# Patient Record
Sex: Male | Born: 1985 | Race: Black or African American | Hispanic: No | Marital: Single | Smoking: Current every day smoker
Health system: Southern US, Community
[De-identification: ages and names within clinical notes are randomized; demographics above are authoritative.]

---
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
## 2009-02-15 ENCOUNTER — Emergency Department (HOSPITAL_COMMUNITY): Admission: EM | Admit: 2009-02-15 | Discharge: 2009-02-15 | Payer: Self-pay | Admitting: Family Medicine

## 2012-05-23 ENCOUNTER — Emergency Department (HOSPITAL_COMMUNITY): Payer: No Typology Code available for payment source

## 2012-05-23 ENCOUNTER — Encounter (HOSPITAL_COMMUNITY): Payer: Self-pay | Admitting: Emergency Medicine

## 2012-05-23 ENCOUNTER — Emergency Department (HOSPITAL_COMMUNITY)
Admission: EM | Admit: 2012-05-23 | Discharge: 2012-05-23 | Disposition: A | Discharge status: Home / Self Care | Payer: No Typology Code available for payment source | Attending: Emergency Medicine | Admitting: Emergency Medicine

## 2012-05-23 DIAGNOSIS — M542 Cervicalgia: Secondary | ICD-10-CM | POA: Insufficient documentation

## 2012-05-23 DIAGNOSIS — S7001XA Contusion of right hip, initial encounter: Secondary | ICD-10-CM

## 2012-05-23 DIAGNOSIS — S7000XA Contusion of unspecified hip, initial encounter: Secondary | ICD-10-CM | POA: Insufficient documentation

## 2012-05-23 DIAGNOSIS — Y9389 Activity, other specified: Secondary | ICD-10-CM | POA: Insufficient documentation

## 2012-05-23 DIAGNOSIS — Y92410 Unspecified street and highway as the place of occurrence of the external cause: Secondary | ICD-10-CM | POA: Insufficient documentation

## 2012-05-23 DIAGNOSIS — F17200 Nicotine dependence, unspecified, uncomplicated: Secondary | ICD-10-CM | POA: Insufficient documentation

## 2012-05-23 DIAGNOSIS — M546 Pain in thoracic spine: Secondary | ICD-10-CM | POA: Insufficient documentation

## 2012-05-23 DIAGNOSIS — R51 Headache: Secondary | ICD-10-CM | POA: Insufficient documentation

## 2012-05-23 MED ORDER — NAPROXEN 375 MG PO TABS: 375.0000 mg | ORAL_TABLET | Freq: Two times a day (BID) | ORAL | Status: DC

## 2012-05-23 MED ORDER — TRAMADOL HCL 50 MG PO TABS: 50.0000 mg | ORAL_TABLET | Freq: Four times a day (QID) | ORAL | Status: DC | PRN

## 2012-05-23 MED ORDER — CYCLOBENZAPRINE HCL 10 MG PO TABS: 10.0000 mg | ORAL_TABLET | Freq: Two times a day (BID) | ORAL | Status: DC | PRN
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
## 2012-05-26 NOTE — ED Provider Notes (Signed)
Medical screening examination/treatment/procedure(s) were performed by non-physician practitioner and as supervising physician I was immediately available for consultation/collaboration.  Lasheba Stevens R. Ellene Bloodsaw, MD 05/26/12 1102 

## 2016-01-06 ENCOUNTER — Encounter (HOSPITAL_COMMUNITY): Payer: Self-pay

## 2016-01-06 ENCOUNTER — Emergency Department (HOSPITAL_COMMUNITY)
Admission: EM | Admit: 2016-01-06 | Discharge: 2016-01-06 | Disposition: A | Discharge status: Home / Self Care | Payer: Self-pay | Attending: Emergency Medicine | Admitting: Emergency Medicine

## 2016-01-06 ENCOUNTER — Emergency Department (HOSPITAL_COMMUNITY): Payer: Self-pay

## 2016-01-06 DIAGNOSIS — Z79899 Other long term (current) drug therapy: Secondary | ICD-10-CM | POA: Insufficient documentation

## 2016-01-06 DIAGNOSIS — S8392XA Sprain of unspecified site of left knee, initial encounter: Secondary | ICD-10-CM | POA: Insufficient documentation

## 2016-01-06 DIAGNOSIS — S60512A Abrasion of left hand, initial encounter: Secondary | ICD-10-CM | POA: Insufficient documentation

## 2016-01-06 DIAGNOSIS — T148XXA Other injury of unspecified body region, initial encounter: Secondary | ICD-10-CM

## 2016-01-06 DIAGNOSIS — Y9389 Activity, other specified: Secondary | ICD-10-CM | POA: Insufficient documentation

## 2016-01-06 DIAGNOSIS — F17210 Nicotine dependence, cigarettes, uncomplicated: Secondary | ICD-10-CM | POA: Insufficient documentation

## 2016-01-06 DIAGNOSIS — K0889 Other specified disorders of teeth and supporting structures: Secondary | ICD-10-CM | POA: Insufficient documentation

## 2016-01-06 DIAGNOSIS — T1490XA Injury, unspecified, initial encounter: Secondary | ICD-10-CM

## 2016-01-06 DIAGNOSIS — Y929 Unspecified place or not applicable: Secondary | ICD-10-CM | POA: Insufficient documentation

## 2016-01-06 DIAGNOSIS — Z23 Encounter for immunization: Secondary | ICD-10-CM | POA: Insufficient documentation

## 2016-01-06 DIAGNOSIS — Y999 Unspecified external cause status: Secondary | ICD-10-CM | POA: Insufficient documentation

## 2016-01-06 MED ORDER — HYDROCODONE-ACETAMINOPHEN 5-325 MG PO TABS: ORAL_TABLET | ORAL | 0 refills | Status: DC

## 2016-01-06 MED ORDER — BACITRACIN ZINC 500 UNIT/GM EX OINT
TOPICAL_OINTMENT | CUTANEOUS | Status: AC
  Administered 2016-01-06: 04:00:00
  Filled 2016-01-06: qty 2.7

## 2016-01-06 MED ORDER — TETANUS-DIPHTH-ACELL PERTUSSIS 5-2.5-18.5 LF-MCG/0.5 IM SUSP
0.5000 mL | Freq: Once | INTRAMUSCULAR | Status: AC
  Administered 2016-01-06: 0.5 mL via INTRAMUSCULAR
  Filled 2016-01-06: qty 0.5

## 2016-01-06 MED ORDER — BACITRACIN ZINC 500 UNIT/GM EX OINT: 1.0000 "application " | TOPICAL_OINTMENT | Freq: Once | CUTANEOUS | Status: DC

## 2016-01-06 MED ORDER — HYDROCODONE-ACETAMINOPHEN 5-325 MG PO TABS
1.0000 | ORAL_TABLET | Freq: Once | ORAL | Status: AC
  Administered 2016-01-06: 1 via ORAL
  Filled 2016-01-06: qty 1

## 2016-01-06 NOTE — Discharge Instructions (Signed)
Wash the affected area with soap and water and apply a thin layer of topical antibiotic ointment. Do this every 12 hours.   Do not use rubbing alcohol or hydrogen peroxide.                        Look for signs of infection: if you see redness, if the area becomes warm, if pain increases sharply, there is discharge (pus), if red streaks appear or you develop fever or vomiting, RETURN immediately to the Emergency Department  for a recheck.    Take vicodin for breakthrough pain, do not drink alcohol, drive, care for children or do other critical tasks while taking vicodin.
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
## 2016-08-18 ENCOUNTER — Emergency Department (HOSPITAL_COMMUNITY)
Admission: EM | Admit: 2016-08-18 | Discharge: 2016-08-18 | Disposition: A | Discharge status: Home / Self Care | Payer: Self-pay | Attending: Emergency Medicine | Admitting: Emergency Medicine

## 2016-08-18 ENCOUNTER — Encounter (HOSPITAL_COMMUNITY): Payer: Self-pay | Admitting: *Deleted

## 2016-08-18 DIAGNOSIS — F17210 Nicotine dependence, cigarettes, uncomplicated: Secondary | ICD-10-CM | POA: Insufficient documentation

## 2016-08-18 DIAGNOSIS — Z79899 Other long term (current) drug therapy: Secondary | ICD-10-CM | POA: Insufficient documentation

## 2016-08-18 DIAGNOSIS — K0889 Other specified disorders of teeth and supporting structures: Secondary | ICD-10-CM | POA: Insufficient documentation

## 2016-08-18 MED ORDER — AMOXICILLIN 500 MG PO CAPS: 500.0000 mg | ORAL_CAPSULE | Freq: Three times a day (TID) | ORAL | 0 refills | Status: DC

## 2016-08-18 MED ORDER — ONDANSETRON 4 MG PO TBDP
4.0000 mg | ORAL_TABLET | Freq: Once | ORAL | Status: AC
  Administered 2016-08-18: 4 mg via ORAL
  Filled 2016-08-18: qty 1

## 2016-08-18 MED ORDER — OXYCODONE-ACETAMINOPHEN 5-325 MG PO TABS
2.0000 | ORAL_TABLET | Freq: Once | ORAL | Status: AC
  Administered 2016-08-18: 2 via ORAL
  Filled 2016-08-18: qty 2

## 2016-08-18 MED ORDER — MELOXICAM 15 MG PO TABS: 15.0000 mg | ORAL_TABLET | Freq: Every day | ORAL | 0 refills | Status: DC

## 2016-08-18 MED ORDER — HYDROCODONE-ACETAMINOPHEN 5-325 MG PO TABS: 1.0000 | ORAL_TABLET | Freq: Four times a day (QID) | ORAL | 0 refills | Status: DC | PRN

## 2016-08-18 NOTE — Discharge Instructions (Signed)
You have been seen by your caregiver because of dental pain.  °SEEK MEDICAL ATTENTION IF: °The exam and treatment you received today has been provided on an emergency basis only. This is not a substitute for complete medical or dental care. If your problem worsens or new symptoms (problems) appear, and you are unable to arrange prompt follow-up care with your dentist, call or return to this location. °CALL YOUR DENTIST OR RETURN IMMEDIATELY IF you develop a fever, rash, difficulty breathing or swallowing, neck or facial swelling, or other potentially serious concerns. ° ° °[employer]  °[employer]  °[facility]  °[address]  °[place], [place] [address]  °Phone [phone]  °[facility] [employer] [facility] [place], [place], exemplifies the [employer] School?s vision to improve the health and quality of life of all [place]ians by creating leaders with a passion to care for the underserved and by leading the nation in community-based, service learning oral health education. °We are committed to offering comprehensive general dental services for adults, children and special needs patients in a safe, caring and professional setting. ° °Appointments: Our clinic is open Monday through Friday 8:00 a.m. until 5:00 p.m. The amount of time scheduled for an appointment depends on the patient?s specific needs. We ask that you keep your appointed time for care or provide 24-hour notice of all appointment changes. Parents or legal guardians must accompany minor children. ° °Payment for Services: Medicaid and other insurance plans are welcome. Payment for services is due when services are rendered and may be made by cash or credit card. If you have dental insurance, we will assist you with your claim submission.  ° °Emergencies: Emergency services will be provided Monday through Friday on a  walk-in basis. Please arrive early for emergency services. After hours emergency services will be provided for patients of record as required. ° °Services:  °Comprehensive General Dentistry  °Children?s Dentistry  °Oral Surgery - Extractions  °Root Canals  °Sealants and Tooth Colored Fillings  °Crowns and Bridges  °Dentures and Partial Dentures  °Implant Services  °Periodontal Services and Cleanings  °Cosmetic Tooth Whitening  °Digital Radiography  °3-D/[facility] Imaging ° ° °

## 2016-08-18 NOTE — ED Provider Notes (Signed)
[facility] Provider Note    By signing my name below, I, Earmon Phoenix, attest that this documentation has been prepared under the direction and in the presence of Arthor Captain, PA-C. Electronically Signed: Earmon Phoenix, ED Scribe. 08/18/16. 11:03 AM.    History   Chief Complaint Chief Complaint  Patient presents with  . Dental Pain   The history is provided by the patient and medical records. No language interpreter was used.    Randall Schroeder is a 31 y.o. male who presents to the Emergency Department complaining of intermittent right sided throbbing, sharp HA that has been present for one week. He reports associated right ear pain. Pt states he believes the pain is coming from his upper right wisdom tooth. He has been taking Ibuprofen and rinsing with peroxide for pain with no significant relief. Touching the area increases the pain. He denies visual changes, fever, chills, nasal congestion, nausea, vomiting. He is a smoker.   History reviewed. No pertinent past medical history.  There are no active problems to display for this patient.   History reviewed. No pertinent surgical history.     Home Medications    Prior to Admission medications   Medication Sig Start Date End Date Taking? Authorizing Provider  amoxicillin (AMOXIL) 500 MG capsule Take 1 capsule (500 mg total) by mouth 3 (three) times daily. 08/18/16   Arthor Captain, PA-C  amoxicillin-clavulanate (AUGMENTIN) 875-125 MG tablet Take 1 tablet by mouth 2 (two) times daily. 08/24/16 08/31/16  Mathews Robinsons B, PA-C  cyclobenzaprine (FLEXERIL) 10 MG tablet Take 1 tablet (10 mg total) by mouth 2 (two) times daily as needed for muscle spasms. Patient not taking: Reported on 01/06/2016 05/23/12   Jaynie Crumble, PA-C  HYDROcodone-acetaminophen (NORCO) 5-325 MG tablet Take 1-2 tablets by mouth every 6 (six) hours as needed for severe pain. 08/18/16   Arthor Captain, PA-C  ibuprofen  (ADVIL,MOTRIN) 800 MG tablet Take 1 tablet (800 mg total) by mouth every 8 (eight) hours as needed. 08/24/16   Mathews Robinsons B, PA-C  magic mouthwash SOLN Take 5 mLs by mouth 3 (three) times daily as needed for mouth pain. 08/24/16   Georgiana Shore, PA-C  meloxicam (MOBIC) 15 MG tablet Take 1 tablet (15 mg total) by mouth daily. Take 1 daily with food. 08/18/16   Kindal Ponti, Cammy Copa, PA-C  naproxen (NAPROSYN) 375 MG tablet Take 1 tablet (375 mg total) by mouth 2 (two) times daily. Patient not taking: Reported on 01/06/2016 05/23/12   Jaynie Crumble, PA-C  traMADol (ULTRAM) 50 MG tablet Take 1 tablet (50 mg total) by mouth every 6 (six) hours as needed for pain. Patient not taking: Reported on 01/06/2016 05/23/12   Jaynie Crumble, PA-C    Family History Family History  Problem Relation Age of Onset  . Lupus Mother     Social History Social History  Substance Use Topics  . Smoking status: Current Every Day Smoker    Types: Cigarettes  . Smokeless tobacco: Never Used  . Alcohol use Yes     Comment: socially     Allergies   Patient has no known allergies.   Review of Systems Review of Systems  HENT: Positive for dental problem.   Neurological: Positive for headaches.     Physical Exam Updated Vital Signs BP 123/78 (BP Location: Left Arm)   Pulse 73   Temp 97.9 F (36.6 C) (Oral)   Resp 16   SpO2 100%   Physical Exam  Constitutional: He  is oriented to person, place, and time. He appears well-developed and well-nourished.  HENT:  Head: Normocephalic and atraumatic.  Mouth/Throat: Uvula is midline, oropharynx is clear and moist and mucous membranes are normal. No trismus in the jaw. Abnormal dentition.  Clear fluid behind bilateral TMs. Right upper third molar is markedly loose.  Neck: Normal range of motion.  Cardiovascular: Normal rate.   Pulmonary/Chest: Effort normal.  Musculoskeletal: Normal range of motion.  Neurological: He is alert and oriented to  person, place, and time.  Skin: Skin is warm and dry.  Psychiatric: He has a normal mood and affect. His behavior is normal.  Nursing note and vitals reviewed.    ED Treatments / Results  DIAGNOSTIC STUDIES: Oxygen Saturation is 100% on RA, normal by my interpretation.   COORDINATION OF CARE: 11:02 AM- Will prescribe antibiotic and pain medication. Will give dental referral. Pt verbalizes understanding and agrees to plan.  Medications  oxyCODONE-acetaminophen (PERCOCET/ROXICET) 5-325 MG per tablet 2 tablet (2 tablets Oral Given 08/18/16 1110)  ondansetron (ZOFRAN-ODT) disintegrating tablet 4 mg (4 mg Oral Given 08/18/16 1110)    Labs (all labs ordered are listed, but only abnormal results are displayed) Labs Reviewed - No data to display  EKG  EKG Interpretation None       Radiology No results found.  Procedures Procedures (including critical care time)  Medications Ordered in ED Medications  oxyCODONE-acetaminophen (PERCOCET/ROXICET) 5-325 MG per tablet 2 tablet (2 tablets Oral Given 08/18/16 1110)  ondansetron (ZOFRAN-ODT) disintegrating tablet 4 mg (4 mg Oral Given 08/18/16 1110)     Initial Impression / Assessment and Plan / ED Course  I have reviewed the triage vital signs and the nursing notes.  Pertinent labs & imaging results that were available during my care of the patient were reviewed by me and considered in my medical decision making (see chart for details).     Patient with dentalgia. No abscess requiring immediate incision and drainage. Exam not concerning for Ludwig's angina or pharyngeal abscess. Will treat with Amoxicillin. Pt instructed to follow-up with dentist.  Discussed return precautions. Pt safe for discharge.   Final Clinical Impressions(s) / ED Diagnoses   Final diagnoses:  Pain, dental    New Prescriptions Discharge Medication List as of 08/18/2016 12:06 PM    START taking these medications   Details  amoxicillin (AMOXIL) 500 MG  capsule Take 1 capsule (500 mg total) by mouth 3 (three) times daily., Starting Mon 08/18/2016, Print    meloxicam (MOBIC) 15 MG tablet Take 1 tablet (15 mg total) by mouth daily. Take 1 daily with food., Starting Mon 08/18/2016, Print        I personally performed the services described in this documentation, which was scribed in my presence. The recorded information has been reviewed and is accurate.       Arthor CaptainHarris, Heidemarie Goodnow, PA-C 08/25/16 0012    Marily MemosMesner, Jason, MD 08/25/16 [phone]

## 2016-08-18 NOTE — ED Triage Notes (Signed)
c/o right sided headache onset 1 weeks ago, states he thinks its his tooth . Using OTC meds without relief.
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
## 2016-08-19 MED FILL — HYDROCODON-APAP 5-325: 5-325 | 2 days supply | Qty: 10 | Fill #0

## 2016-08-24 ENCOUNTER — Emergency Department (HOSPITAL_COMMUNITY)
Admission: EM | Admit: 2016-08-24 | Discharge: 2016-08-24 | Disposition: A | Discharge status: Home / Self Care | Payer: Self-pay | Attending: Emergency Medicine | Admitting: Emergency Medicine

## 2016-08-24 ENCOUNTER — Encounter (HOSPITAL_COMMUNITY): Payer: Self-pay | Admitting: Emergency Medicine

## 2016-08-24 DIAGNOSIS — K0889 Other specified disorders of teeth and supporting structures: Secondary | ICD-10-CM | POA: Insufficient documentation

## 2016-08-24 DIAGNOSIS — F17210 Nicotine dependence, cigarettes, uncomplicated: Secondary | ICD-10-CM | POA: Insufficient documentation

## 2016-08-24 MED ORDER — IBUPROFEN 800 MG PO TABS
800.0000 mg | ORAL_TABLET | Freq: Once | ORAL | Status: AC
  Administered 2016-08-24: 800 mg via ORAL
  Filled 2016-08-24: qty 1

## 2016-08-24 MED ORDER — MAGIC MOUTHWASH W/LIDOCAINE
5.0000 mL | Freq: Once | ORAL | Status: DC
  Filled 2016-08-24: qty 5

## 2016-08-24 MED ORDER — AMOXICILLIN-POT CLAVULANATE 875-125 MG PO TABS: 1.0000 | ORAL_TABLET | Freq: Two times a day (BID) | ORAL | 0 refills | Status: AC

## 2016-08-24 MED ORDER — MAGIC MOUTHWASH: 5.0000 mL | Freq: Three times a day (TID) | ORAL | 0 refills | Status: DC | PRN

## 2016-08-24 MED ORDER — IBUPROFEN 800 MG PO TABS: 800.0000 mg | ORAL_TABLET | Freq: Three times a day (TID) | ORAL | 0 refills | Status: DC | PRN

## 2016-08-24 NOTE — ED Triage Notes (Signed)
Same dental pain as before but states now has bad breathe and it hurts worse

## 2016-08-24 NOTE — ED Notes (Signed)
Pt is unable to wait for [facility] to come from pharmacy due to a family emergency. Left before receiving med. Has Rx for same.

## 2016-08-24 NOTE — ED Notes (Addendum)
Pt brought back to room, pt asking how long its going to be.

## 2016-08-24 NOTE — Discharge Instructions (Signed)
Use the mouthwash for pain relief and ibuprofen and follow-up with your dentist appointment as soon as possible.  Return to the ER if you experience difficulty swallowing or breathing, or any other new concerning symptoms in the meantime

## 2016-08-24 NOTE — ED Provider Notes (Signed)
[facility] DEPT Provider Note   CSN: [id number] Arrival date & time: 08/24/16  1240  By signing my name below, I, Rosana Fret, attest that this documentation has been prepared under the direction and in the presence of non-physician practitioner, Shanda Bumps B. Clovis Riley, PA-C. Electronically Signed: Rosana Fret, ED Scribe. 08/24/16. 2:23 PM.  History   Chief Complaint Chief Complaint  Patient presents with  . Dental Pain   The history is provided by the patient. No language interpreter was used.   HPI Comments: Randall Schroeder is a 31 y.o. male who presents to the Emergency Department complaining of gradually worsening dental pain onset a few weeks ago. Pt states he came to the ED 2 weeks ago and was given antibiotics, which gave him initial relief but his symptoms became exacerbated this past week. Pt notes there is a yellow liquid draining from the area. Per pt, the area is sensitive to cold temperatures. Pt reports associated chills and gum swelling. Pt has tried cleansing with salt water and Tylenol with no relief. Pt denies fever, nausea, vomiting, diarrhea, trouble swallowing or any other complaints at this time.  History reviewed. No pertinent past medical history.  There are no active problems to display for this patient.   History reviewed. No pertinent surgical history.     Home Medications    Prior to Admission medications   Medication Sig Start Date End Date Taking? Authorizing Provider  amoxicillin (AMOXIL) 500 MG capsule Take 1 capsule (500 mg total) by mouth 3 (three) times daily. 08/18/16   Arthor Captain, PA-C  amoxicillin-clavulanate (AUGMENTIN) 875-125 MG tablet Take 1 tablet by mouth 2 (two) times daily. 08/24/16 08/31/16  Mathews Robinsons B, PA-C  cyclobenzaprine (FLEXERIL) 10 MG tablet Take 1 tablet (10 mg total) by mouth 2 (two) times daily as needed for muscle spasms. Patient not taking: Reported on 01/06/2016 05/23/12   Jaynie Crumble, PA-C    HYDROcodone-acetaminophen (NORCO) 5-325 MG tablet Take 1-2 tablets by mouth every 6 (six) hours as needed for severe pain. 08/18/16   Arthor Captain, PA-C  ibuprofen (ADVIL,MOTRIN) 800 MG tablet Take 1 tablet (800 mg total) by mouth every 8 (eight) hours as needed. 08/24/16   Mathews Robinsons B, PA-C  magic mouthwash SOLN Take 5 mLs by mouth 3 (three) times daily as needed for mouth pain. 08/24/16   Georgiana Shore, PA-C  meloxicam (MOBIC) 15 MG tablet Take 1 tablet (15 mg total) by mouth daily. Take 1 daily with food. 08/18/16   Harris, Cammy Copa, PA-C  naproxen (NAPROSYN) 375 MG tablet Take 1 tablet (375 mg total) by mouth 2 (two) times daily. Patient not taking: Reported on 01/06/2016 05/23/12   Jaynie Crumble, PA-C  traMADol (ULTRAM) 50 MG tablet Take 1 tablet (50 mg total) by mouth every 6 (six) hours as needed for pain. Patient not taking: Reported on 01/06/2016 05/23/12   Jaynie Crumble, PA-C    Family History Family History  Problem Relation Age of Onset  . Lupus Mother     Social History Social History  Substance Use Topics  . Smoking status: Current Every Day Smoker    Types: Cigarettes  . Smokeless tobacco: Never Used  . Alcohol use Yes     Comment: socially     Allergies   Patient has no known allergies.   Review of Systems Review of Systems  Constitutional: Positive for chills. Negative for fever.  HENT: Positive for dental problem. Negative for drooling, ear pain, sore throat, trouble swallowing and voice  change.   Eyes: Negative for pain and visual disturbance.  Respiratory: Negative for cough, shortness of breath, wheezing and stridor.   Cardiovascular: Negative for chest pain and palpitations.  Gastrointestinal: Negative for abdominal pain, diarrhea, nausea and vomiting.  Genitourinary: Negative for dysuria and hematuria.  Musculoskeletal: Negative for arthralgias, back pain, myalgias, neck pain and neck stiffness.  Skin: Negative for color change and  rash.  Neurological: Negative for seizures, syncope and speech difficulty.     Physical Exam Updated Vital Signs BP 120/85 (BP Location: Left Arm)   Pulse 88   Temp 98.8 F (37.1 C) (Oral)   Resp 16   SpO2 99%   Physical Exam  Constitutional: He is oriented to person, place, and time. He appears well-developed and well-nourished.  Patient is afebrile, non-toxic appearing, seating comfortably in chair in no acute distress.   HENT:  Head: Normocephalic and atraumatic.  Mouth/Throat: Uvula is midline and oropharynx is clear and moist. No oral lesions. No trismus in the jaw. No uvula swelling. No oropharyngeal exudate.    Tolerates oral secretions. Sublingual mucosa is soft and non-tender. No concern for Ludwig's angina. No visible abscess  Cardiovascular: Normal rate, regular rhythm and normal heart sounds.  Exam reveals no gallop.   No murmur heard. Pulmonary/Chest: Effort normal and breath sounds normal. No respiratory distress. He has no wheezes.  Lungs are clear bilaterally.   Lymphadenopathy:    He has no cervical adenopathy.  Neurological: He is alert and oriented to person, place, and time.  Skin: Skin is warm and dry.  Psychiatric: He has a normal mood and affect.  Nursing note and vitals reviewed.    ED Treatments / Results  DIAGNOSTIC STUDIES: Oxygen Saturation is 99% on RA, normal by my interpretation.   COORDINATION OF CARE: 2:01 PM-Discussed next steps with pt including prescription for antibiotics and pain management at home. Pt verbalized understanding and is agreeable with the plan.   Labs (all labs ordered are listed, but only abnormal results are displayed) Labs Reviewed - No data to display  EKG  EKG Interpretation None       Radiology No results found.  Procedures Procedures (including critical care time)  Medications Ordered in ED Medications  magic mouthwash w/lidocaine (not administered)  ibuprofen (ADVIL,MOTRIN) tablet 800 mg (800  mg Oral Given 08/24/16 1424)     Initial Impression / Assessment and Plan / ED Course  I have reviewed the triage vital signs and the nursing notes.  Pertinent labs & imaging results that were available during my care of the patient were reviewed by me and considered in my medical decision making (see chart for details).    Patient with dentalgia.  No abscess requiring immediate incision and drainage.  Exam not concerning for Ludwig's angina or pharyngeal abscess.  Will treat with Augmentin. Pt instructed to follow-up with dentist.He has an appointment already scheduled this week.  Discussed return precautions. Pt safe for discharge.  Pain managed in the ED with ibuprofen and Magic mouthwash. Patient reported improvement.  Discussed strict return precautions and advised to return to the emergency department if experiencing any new or worsening symptoms. Instructions were understood and patient agreed with discharge plan.  Final Clinical Impressions(s) / ED Diagnoses   Final diagnoses:  Pain, dental    New Prescriptions Discharge Medication List as of 08/24/2016  2:23 PM    START taking these medications   Details  amoxicillin-clavulanate (AUGMENTIN) 875-125 MG tablet Take 1 tablet by mouth 2 (two)  times daily., Starting Sun 08/24/2016, Until Sun 08/31/2016, Print    ibuprofen (ADVIL,MOTRIN) 800 MG tablet Take 1 tablet (800 mg total) by mouth every 8 (eight) hours as needed., Starting Sun 08/24/2016, Print    magic mouthwash SOLN Take 5 mLs by mouth 3 (three) times daily as needed for mouth pain., Starting Sun 08/24/2016, Print       I personally performed the services described in this documentation, which was scribed in my presence. The recorded information has been reviewed and is accurate.     Georgiana ShoreMitchell, Deziree Mokry B, PA-C 08/24/16 1513    Benjiman CorePickering, Nathan, MD 08/24/16 [phone]

## 2016-08-24 NOTE — ED Notes (Addendum)
Pt was seen here 5/14 for dental pain. States he took his ABX but 2 days later, started with "bad breath" and right facial pain and swelling.

## 2016-12-10 ENCOUNTER — Encounter (HOSPITAL_COMMUNITY): Payer: Self-pay | Admitting: Emergency Medicine

## 2016-12-10 ENCOUNTER — Emergency Department (HOSPITAL_COMMUNITY)
Admission: EM | Admit: 2016-12-10 | Discharge: 2016-12-10 | Disposition: A | Discharge status: Home / Self Care | Payer: Self-pay | Attending: Emergency Medicine | Admitting: Emergency Medicine

## 2016-12-10 DIAGNOSIS — R451 Restlessness and agitation: Secondary | ICD-10-CM | POA: Insufficient documentation

## 2016-12-10 DIAGNOSIS — F1410 Cocaine abuse, uncomplicated: Secondary | ICD-10-CM | POA: Insufficient documentation

## 2016-12-10 DIAGNOSIS — F1490 Cocaine use, unspecified, uncomplicated: Secondary | ICD-10-CM

## 2016-12-10 NOTE — ED Notes (Signed)
Pt. refused to wear papers scrub , pant and socks offered by RN .

## 2016-12-10 NOTE — ED Notes (Signed)
Pt. agreed to wear paper scrub/pants and socks . EDP notified that pt. Is requesting to go home . [place] police removed his handcuff.
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
## 2016-12-10 NOTE — ED Notes (Signed)
Dr. Dalene SeltzerSchlossman at bedside speaking with pt. , pt. continues to refuse vital signs check at this time . Alert and oriented , pt. calmer while speaking with EDP .

## 2017-09-07 ENCOUNTER — Emergency Department (HOSPITAL_COMMUNITY)
Admission: EM | Admit: 2017-09-07 | Discharge: 2017-09-07 | Disposition: A | Discharge status: Home / Self Care | Payer: Self-pay | Attending: Emergency Medicine | Admitting: Emergency Medicine

## 2017-09-07 ENCOUNTER — Encounter (HOSPITAL_COMMUNITY): Payer: Self-pay | Admitting: *Deleted

## 2017-09-07 ENCOUNTER — Emergency Department (HOSPITAL_COMMUNITY): Payer: Self-pay

## 2017-09-07 DIAGNOSIS — Y998 Other external cause status: Secondary | ICD-10-CM | POA: Insufficient documentation

## 2017-09-07 DIAGNOSIS — Y929 Unspecified place or not applicable: Secondary | ICD-10-CM | POA: Insufficient documentation

## 2017-09-07 DIAGNOSIS — S62336A Displaced fracture of neck of fifth metacarpal bone, right hand, initial encounter for closed fracture: Secondary | ICD-10-CM | POA: Insufficient documentation

## 2017-09-07 DIAGNOSIS — X58XXXA Exposure to other specified factors, initial encounter: Secondary | ICD-10-CM | POA: Insufficient documentation

## 2017-09-07 DIAGNOSIS — F1210 Cannabis abuse, uncomplicated: Secondary | ICD-10-CM | POA: Insufficient documentation

## 2017-09-07 DIAGNOSIS — R2231 Localized swelling, mass and lump, right upper limb: Secondary | ICD-10-CM | POA: Insufficient documentation

## 2017-09-07 DIAGNOSIS — Y9389 Activity, other specified: Secondary | ICD-10-CM | POA: Insufficient documentation

## 2017-09-07 DIAGNOSIS — F17210 Nicotine dependence, cigarettes, uncomplicated: Secondary | ICD-10-CM | POA: Insufficient documentation

## 2017-09-07 DIAGNOSIS — Z79899 Other long term (current) drug therapy: Secondary | ICD-10-CM | POA: Insufficient documentation

## 2017-09-07 DIAGNOSIS — F1410 Cocaine abuse, uncomplicated: Secondary | ICD-10-CM | POA: Insufficient documentation

## 2017-09-07 MED ORDER — IBUPROFEN 800 MG PO TABS: 800.0000 mg | ORAL_TABLET | Freq: Three times a day (TID) | ORAL | 0 refills | Status: DC

## 2017-09-07 MED ORDER — TRAMADOL HCL 50 MG PO TABS: 50.0000 mg | ORAL_TABLET | Freq: Two times a day (BID) | ORAL | 0 refills | Status: DC | PRN
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
## 2017-09-07 NOTE — Discharge Instructions (Addendum)
Take ibuprofen 3 times a day with meals.  Do not take other anti-inflammatories at the same time open (Advil, Motrin, naproxen, Aleve). You may supplement with Tramadol as needed for severe or breakthrough pain. Keep your hand elevated when able. Call Dr. Merrilee SeashoreKuzma's office tomorrow to set up an appointment for follow-up and further management of your hand. Return to the emergency room if you develop numbness, your fingers turn dark or white, or with any new or concerning symptoms.
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
## 2017-09-07 NOTE — Progress Notes (Signed)
Orthopedic Tech Progress Note Patient Details:  Randall Schroeder 05/07/1985 [id number]  Ortho Devices Type of Ortho Device: Ace wrap, Ulna gutter splint Ortho Device/Splint Location: RUE Ortho Device/Splint Interventions: Ordered, Application   Post Interventions Patient Tolerated: Well Instructions Provided: Care of device   Jennye MoccasinHughes, Rowyn Spilde Craig 09/07/2017, 8:40 PM

## 2017-09-07 NOTE — ED Notes (Signed)
Ortho paged. 

## 2017-09-09 ENCOUNTER — Encounter (HOSPITAL_BASED_OUTPATIENT_CLINIC_OR_DEPARTMENT_OTHER): Payer: Self-pay | Admitting: *Deleted

## 2017-09-09 ENCOUNTER — Other Ambulatory Visit: Payer: Self-pay

## 2017-09-09 NOTE — Pre-Procedure Instructions (Signed)
Discussed with Dr. Sampson GoonFitzgerald pt's use of Crack and marijuana - order urine drug screen the day before surgery. Told pt to bring all medications, come in Monday for urine drug screen.

## 2017-09-10 ENCOUNTER — Other Ambulatory Visit: Payer: Self-pay | Admitting: Orthopedic Surgery

## 2017-09-14 ENCOUNTER — Encounter (HOSPITAL_BASED_OUTPATIENT_CLINIC_OR_DEPARTMENT_OTHER)
Admission: RE | Admit: 2017-09-14 | Discharge: 2017-09-14 | Disposition: A | Discharge status: Home / Self Care | Payer: Self-pay | Source: Ambulatory Visit | Attending: Orthopedic Surgery | Admitting: Orthopedic Surgery

## 2017-09-14 LAB — RAPID URINE DRUG SCREEN, HOSP PERFORMED
Amphetamines: NOT DETECTED
Barbiturates: NOT DETECTED
Benzodiazepines: NOT DETECTED
COCAINE: NOT DETECTED
Opiates: NOT DETECTED
TETRAHYDROCANNABINOL: POSITIVE — AB

## 2017-09-15 ENCOUNTER — Other Ambulatory Visit: Payer: Self-pay

## 2017-09-15 ENCOUNTER — Ambulatory Visit (HOSPITAL_BASED_OUTPATIENT_CLINIC_OR_DEPARTMENT_OTHER): Payer: Self-pay | Admitting: Anesthesiology

## 2017-09-15 ENCOUNTER — Encounter (HOSPITAL_BASED_OUTPATIENT_CLINIC_OR_DEPARTMENT_OTHER): Payer: Self-pay | Admitting: Certified Registered"

## 2017-09-15 ENCOUNTER — Encounter (HOSPITAL_BASED_OUTPATIENT_CLINIC_OR_DEPARTMENT_OTHER)
Admission: RE | Disposition: A | Discharge status: Home / Self Care | Payer: Self-pay | Source: Ambulatory Visit | Attending: Orthopedic Surgery

## 2017-09-15 ENCOUNTER — Ambulatory Visit (HOSPITAL_BASED_OUTPATIENT_CLINIC_OR_DEPARTMENT_OTHER)
Admission: RE | Admit: 2017-09-15 | Discharge: 2017-09-15 | Disposition: A | Discharge status: Home / Self Care | Payer: Self-pay | Source: Ambulatory Visit | Attending: Orthopedic Surgery | Admitting: Orthopedic Surgery

## 2017-09-15 DIAGNOSIS — F17210 Nicotine dependence, cigarettes, uncomplicated: Secondary | ICD-10-CM | POA: Insufficient documentation

## 2017-09-15 DIAGNOSIS — S62336A Displaced fracture of neck of fifth metacarpal bone, right hand, initial encounter for closed fracture: Secondary | ICD-10-CM | POA: Insufficient documentation

## 2017-09-15 HISTORY — PX: CLOSED REDUCTION FINGER WITH PERCUTANEOUS PINNING: SHX5612

## 2017-09-15 SURGERY — CLOSED REDUCTION, FINGER, WITH PERCUTANEOUS PINNING
Anesthesia: General | Site: Hand | Laterality: Right

## 2017-09-15 MED ORDER — HYDROCODONE-ACETAMINOPHEN 5-325 MG PO TABS: ORAL_TABLET | ORAL | 0 refills | Status: DC

## 2017-09-15 MED ORDER — MEPERIDINE HCL 25 MG/ML IJ SOLN: 6.2500 mg | INTRAMUSCULAR | Status: DC | PRN

## 2017-09-15 MED ORDER — ONDANSETRON HCL 4 MG/2ML IJ SOLN
INTRAMUSCULAR | Status: AC
  Filled 2017-09-15: qty 14

## 2017-09-15 MED ORDER — MIDAZOLAM HCL 2 MG/2ML IJ SOLN
INTRAMUSCULAR | Status: AC
  Filled 2017-09-15: qty 2

## 2017-09-15 MED ORDER — DEXAMETHASONE SODIUM PHOSPHATE 10 MG/ML IJ SOLN
INTRAMUSCULAR | Status: AC
  Filled 2017-09-15: qty 3

## 2017-09-15 MED ORDER — CEFAZOLIN SODIUM-DEXTROSE 2-4 GM/100ML-% IV SOLN
INTRAVENOUS | Status: AC
  Filled 2017-09-15: qty 100

## 2017-09-15 MED ORDER — PROPOFOL 10 MG/ML IV BOLUS
INTRAVENOUS | Status: DC | PRN
  Administered 2017-09-15: 150 mg via INTRAVENOUS

## 2017-09-15 MED ORDER — ONDANSETRON HCL 4 MG/2ML IJ SOLN
INTRAMUSCULAR | Status: DC | PRN
  Administered 2017-09-15: 4 mg via INTRAVENOUS

## 2017-09-15 MED ORDER — FENTANYL CITRATE (PF) 100 MCG/2ML IJ SOLN
INTRAMUSCULAR | Status: AC
  Filled 2017-09-15: qty 2

## 2017-09-15 MED ORDER — CEFAZOLIN SODIUM-DEXTROSE 2-4 GM/100ML-% IV SOLN
2.0000 g | INTRAVENOUS | Status: AC
  Administered 2017-09-15: 2 g via INTRAVENOUS

## 2017-09-15 MED ORDER — DEXAMETHASONE SODIUM PHOSPHATE 10 MG/ML IJ SOLN
INTRAMUSCULAR | Status: DC | PRN
  Administered 2017-09-15: 4 mg via INTRAVENOUS

## 2017-09-15 MED ORDER — LIDOCAINE HCL (CARDIAC) PF 100 MG/5ML IV SOSY
PREFILLED_SYRINGE | INTRAVENOUS | Status: AC
  Filled 2017-09-15: qty 5

## 2017-09-15 MED ORDER — PROPOFOL 500 MG/50ML IV EMUL
INTRAVENOUS | Status: DC | PRN
  Administered 2017-09-15: 100 ug/kg/min via INTRAVENOUS

## 2017-09-15 MED ORDER — PROMETHAZINE HCL 25 MG/ML IJ SOLN: 6.2500 mg | INTRAMUSCULAR | Status: DC | PRN

## 2017-09-15 MED ORDER — FENTANYL CITRATE (PF) 100 MCG/2ML IJ SOLN
50.0000 ug | INTRAMUSCULAR | Status: DC | PRN
  Administered 2017-09-15 (×2): 50 ug via INTRAVENOUS

## 2017-09-15 MED ORDER — HYDROMORPHONE HCL 1 MG/ML IJ SOLN
INTRAMUSCULAR | Status: AC
  Filled 2017-09-15: qty 0.5

## 2017-09-15 MED ORDER — CHLORHEXIDINE GLUCONATE 4 % EX LIQD: 60.0000 mL | Freq: Once | CUTANEOUS | Status: DC

## 2017-09-15 MED ORDER — SCOPOLAMINE 1 MG/3DAYS TD PT72
1.0000 | MEDICATED_PATCH | Freq: Once | TRANSDERMAL | Status: DC | PRN
Start: 2017-09-15 — End: 2017-09-15

## 2017-09-15 MED ORDER — HYDROMORPHONE HCL 1 MG/ML IJ SOLN
0.2500 mg | INTRAMUSCULAR | Status: DC | PRN
  Administered 2017-09-15 (×3): 0.5 mg via INTRAVENOUS

## 2017-09-15 MED ORDER — BUPIVACAINE HCL (PF) 0.25 % IJ SOLN
INTRAMUSCULAR | Status: DC | PRN
Start: 2017-09-15 — End: 2017-09-15
  Administered 2017-09-15: 9 mL

## 2017-09-15 MED ORDER — LACTATED RINGERS IV SOLN: INTRAVENOUS | Status: DC

## 2017-09-15 MED ORDER — KETOROLAC TROMETHAMINE 30 MG/ML IJ SOLN
INTRAMUSCULAR | Status: AC
  Filled 2017-09-15: qty 1

## 2017-09-15 MED ORDER — PHENYLEPHRINE 40 MCG/ML (10ML) SYRINGE FOR IV PUSH (FOR BLOOD PRESSURE SUPPORT)
PREFILLED_SYRINGE | INTRAVENOUS | Status: AC
  Filled 2017-09-15: qty 20

## 2017-09-15 MED ORDER — KETOROLAC TROMETHAMINE 30 MG/ML IJ SOLN
30.0000 mg | Freq: Once | INTRAMUSCULAR | Status: AC
  Administered 2017-09-15: 30 mg via INTRAVENOUS

## 2017-09-15 MED ORDER — MIDAZOLAM HCL 2 MG/2ML IJ SOLN
1.0000 mg | INTRAMUSCULAR | Status: DC | PRN
  Administered 2017-09-15: 2 mg via INTRAVENOUS

## 2017-09-15 MED ORDER — LACTATED RINGERS IV SOLN
INTRAVENOUS | Status: DC
  Administered 2017-09-15 (×2): via INTRAVENOUS

## 2017-09-15 SURGICAL SUPPLY — 42 items
BANDAGE ACE 3X5.8 VEL STRL LF (GAUZE/BANDAGES/DRESSINGS) ×2 IMPLANT
BLADE SURG 15 STRL LF DISP TIS (BLADE) ×2 IMPLANT
BLADE SURG 15 STRL SS (BLADE) ×2
BNDG ELASTIC 2X5.8 VLCR STR LF (GAUZE/BANDAGES/DRESSINGS) IMPLANT
BNDG ESMARK 4X9 LF (GAUZE/BANDAGES/DRESSINGS) IMPLANT
BNDG GAUZE ELAST 4 BULKY (GAUZE/BANDAGES/DRESSINGS) ×2 IMPLANT
CHLORAPREP W/TINT 26ML (MISCELLANEOUS) ×2 IMPLANT
CORD BIPOLAR FORCEPS 12FT (ELECTRODE) IMPLANT
COVER BACK TABLE 60X90IN (DRAPES) ×2 IMPLANT
COVER MAYO STAND STRL (DRAPES) ×2 IMPLANT
CUFF TOURNIQUET SINGLE 18IN (TOURNIQUET CUFF) ×2 IMPLANT
DRAPE EXTREMITY T 121X128X90 (DRAPE) ×2 IMPLANT
DRAPE OEC MINIVIEW 54X84 (DRAPES) ×2 IMPLANT
DRAPE SURG 17X23 STRL (DRAPES) ×2 IMPLANT
GAUZE SPONGE 4X4 12PLY STRL (GAUZE/BANDAGES/DRESSINGS) ×2 IMPLANT
GAUZE XEROFORM 1X8 LF (GAUZE/BANDAGES/DRESSINGS) ×2 IMPLANT
GLOVE BIO SURGEON STRL SZ 6.5 (GLOVE) ×2 IMPLANT
GLOVE BIO SURGEON STRL SZ7.5 (GLOVE) ×2 IMPLANT
GLOVE BIOGEL PI IND STRL 7.0 (GLOVE) ×2 IMPLANT
GLOVE BIOGEL PI IND STRL 8 (GLOVE) ×1 IMPLANT
GLOVE BIOGEL PI IND STRL 8.5 (GLOVE) ×1 IMPLANT
GLOVE BIOGEL PI INDICATOR 7.0 (GLOVE) ×2
GLOVE BIOGEL PI INDICATOR 8 (GLOVE) ×1
GLOVE BIOGEL PI INDICATOR 8.5 (GLOVE) ×1
GLOVE SURG ORTHO 8.0 STRL STRW (GLOVE) ×2 IMPLANT
GOWN STRL REUS W/ TWL LRG LVL3 (GOWN DISPOSABLE) ×1 IMPLANT
GOWN STRL REUS W/TWL LRG LVL3 (GOWN DISPOSABLE) ×1
NEEDLE HYPO 25X1 1.5 SAFETY (NEEDLE) IMPLANT
NS IRRIG 1000ML POUR BTL (IV SOLUTION) ×2 IMPLANT
PACK BASIN DAY SURGERY FS (CUSTOM PROCEDURE TRAY) ×2 IMPLANT
PAD CAST 3X4 CTTN HI CHSV (CAST SUPPLIES) IMPLANT
PAD CAST 4YDX4 CTTN HI CHSV (CAST SUPPLIES) IMPLANT
PADDING CAST COTTON 3X4 STRL (CAST SUPPLIES)
PADDING CAST COTTON 4X4 STRL (CAST SUPPLIES)
SLEEVE SCD COMPRESS KNEE MED (MISCELLANEOUS) IMPLANT
STOCKINETTE 4X48 STRL (DRAPES) ×2 IMPLANT
SUT ETHILON 3 0 PS 1 (SUTURE) IMPLANT
SUT ETHILON 4 0 PS 2 18 (SUTURE) IMPLANT
SYR BULB 3OZ (MISCELLANEOUS) IMPLANT
SYR CONTROL 10ML LL (SYRINGE) ×2 IMPLANT
TOWEL GREEN STERILE FF (TOWEL DISPOSABLE) ×4 IMPLANT
UNDERPAD 30X30 (UNDERPADS AND DIAPERS) ×2 IMPLANT

## 2017-09-15 NOTE — Anesthesia Postprocedure Evaluation (Signed)
Anesthesia Post Note  Patient: Randall Schroeder  Procedure(s) Performed: CLOSED REDUCTION RIGHT SMALL  FINGER WITH PERCUTANEOUS PINNING (Right Hand)     Patient location during evaluation: PACU Anesthesia Type: General Level of consciousness: awake and alert Pain management: pain level controlled Vital Signs Assessment: post-procedure vital signs reviewed and stable Respiratory status: spontaneous breathing, nonlabored ventilation, respiratory function stable and patient connected to nasal cannula oxygen Cardiovascular status: blood pressure returned to baseline and stable Postop Assessment: no apparent nausea or vomiting Anesthetic complications: no    Last Vitals:  Vitals:   09/15/17 1445 09/15/17 1511  BP: 138/86 (!) 139/93  Pulse: 60 61  Resp: 15 16  Temp:  36.7 C  SpO2: 100% 100%    Last Pain:  Vitals:   09/15/17 1511  TempSrc:   PainSc: 3                  Shelton SilvasKevin D Colleen Donahoe
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
## 2017-09-15 NOTE — Anesthesia Procedure Notes (Signed)
Anesthesia Regional Block: Bier block (IV Regional)   Pre-Anesthetic Checklist: ,, timeout performed, Correct Patient, Correct Site, Correct Laterality, Correct Procedure,, site marked, surgical consent,, at surgeon's request  Laterality: Right     Needles:  Injection technique: Single-shot  Needle Type: Other      Needle Gauge: 22     Additional Needles:   Procedures:,,,,, intact distal pulses, Esmarch exsanguination, single tourniquet utilized,  Narrative:   Performed by: Personally       

## 2017-09-15 NOTE — Discharge Instructions (Addendum)
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
## 2017-09-15 NOTE — Op Note (Signed)
Intra-operative fluoroscopic images in the AP, lateral, and oblique views were taken and evaluated by myself.  Reduction and hardware placement were confirmed.  There was no intraarticular penetration of permanent hardware.  

## 2017-09-15 NOTE — Anesthesia Procedure Notes (Signed)
Procedure Name: LMA Insertion Date/Time: 09/15/2017 1:32 PM Performed by: Suhayb Anzalone, Jewel Baizeimothy D, CRNA Pre-anesthesia Checklist: Patient identified, Emergency Drugs available, Suction available and Patient being monitored Patient Re-evaluated:Patient Re-evaluated prior to induction Oxygen Delivery Method: Circle system utilized Preoxygenation: Pre-oxygenation with 100% oxygen Induction Type: IV induction Ventilation: Mask ventilation without difficulty LMA: LMA inserted LMA Size: 4.0 Number of attempts: 1 Airway Equipment and Method: Bite block Placement Confirmation: positive ETCO2 Tube secured with: Tape Dental Injury: Teeth and Oropharynx as per pre-operative assessment

## 2017-09-15 NOTE — Anesthesia Procedure Notes (Signed)
Procedure Name: MAC Date/Time: 09/15/2017 1:18 PM Performed by: Signe Colt, CRNA Pre-anesthesia Checklist: Patient identified, Emergency Drugs available, Suction available, Patient being monitored and Timeout performed Patient Re-evaluated:Patient Re-evaluated prior to induction Oxygen Delivery Method: Simple face mask

## 2017-09-15 NOTE — Op Note (Signed)
I assisted Surgeon(s) and Role:    * Betha LoaKuzma, Kevin, MD - Primary    * Cindee SaltKuzma, Zayra Devito, MD - Assisting on the Procedure(s): CLOSED REDUCTION RIGHT SMALL  FINGER WITH PERCUTANEOUS PINNING on 09/15/2017.  I provided assistance on this case as follows: setup, reduction stabilization, fixation, application of the dressings and splints. Electronically signed by: Nicki ReaperKUZMA,Jolleen Seman R, MD Date: 09/15/2017 Time: 1:50 PM

## 2017-09-15 NOTE — Transfer of Care (Signed)
Immediate Anesthesia Transfer of Care Note  Patient: Lou MinerMaurice A Tu  Procedure(s) Performed: CLOSED REDUCTION RIGHT SMALL  FINGER WITH PERCUTANEOUS PINNING (Right Hand)  Patient Location: PACU  Anesthesia Type:General and Bier block  Level of Consciousness: awake and patient cooperative  Airway & Oxygen Therapy: Patient Spontanous Breathing and Patient connected to face mask oxygen  Post-op Assessment: Report given to RN and Post -op Vital signs reviewed and stable  Post vital signs: Reviewed and stable  Last Vitals:  Vitals Value Taken Time  BP    Temp    Pulse 71 09/15/2017  1:52 PM  Resp 15 09/15/2017  1:52 PM  SpO2 100 % 09/15/2017  1:52 PM  Vitals shown include unvalidated device data.  Last Pain:  Vitals:   09/15/17 1236  TempSrc: Oral  PainSc: 0-No pain         Complications: No apparent anesthesia complications

## 2017-09-16 ENCOUNTER — Encounter (HOSPITAL_BASED_OUTPATIENT_CLINIC_OR_DEPARTMENT_OTHER): Payer: Self-pay | Admitting: Orthopedic Surgery

## 2019-10-30 ENCOUNTER — Emergency Department (HOSPITAL_COMMUNITY)
Admission: EM | Admit: 2019-10-30 | Discharge: 2019-10-30 | Disposition: A | Discharge status: Home / Self Care | Payer: Self-pay | Attending: Emergency Medicine | Admitting: Emergency Medicine

## 2019-10-30 ENCOUNTER — Encounter (HOSPITAL_COMMUNITY): Payer: Self-pay

## 2019-10-30 ENCOUNTER — Other Ambulatory Visit: Payer: Self-pay

## 2019-10-30 DIAGNOSIS — Z5321 Procedure and treatment not carried out due to patient leaving prior to being seen by health care provider: Secondary | ICD-10-CM | POA: Insufficient documentation

## 2019-10-30 DIAGNOSIS — R21 Rash and other nonspecific skin eruption: Secondary | ICD-10-CM | POA: Insufficient documentation

## 2019-10-30 NOTE — ED Notes (Signed)
Pt not here when called

## 2019-10-30 NOTE — ED Triage Notes (Addendum)
Pt arrives to ED w/ c/o rash and bilat ankle pain that started 1 week ago. Pt rates pain 10/10, NAD in triage. Pt states he was also the victim of an insect bite.

## 2019-10-31 ENCOUNTER — Encounter (HOSPITAL_COMMUNITY): Payer: Self-pay

## 2019-10-31 ENCOUNTER — Emergency Department (HOSPITAL_COMMUNITY)
Admission: EM | Admit: 2019-10-31 | Discharge: 2019-10-31 | Disposition: A | Discharge status: Home / Self Care | Payer: Self-pay | Attending: Emergency Medicine | Admitting: Emergency Medicine

## 2019-10-31 ENCOUNTER — Other Ambulatory Visit: Payer: Self-pay

## 2019-10-31 DIAGNOSIS — W57XXXA Bitten or stung by nonvenomous insect and other nonvenomous arthropods, initial encounter: Secondary | ICD-10-CM | POA: Insufficient documentation

## 2019-10-31 DIAGNOSIS — Z5321 Procedure and treatment not carried out due to patient leaving prior to being seen by health care provider: Secondary | ICD-10-CM | POA: Insufficient documentation

## 2019-10-31 DIAGNOSIS — S80861A Insect bite (nonvenomous), right lower leg, initial encounter: Secondary | ICD-10-CM | POA: Insufficient documentation

## 2019-10-31 DIAGNOSIS — Y939 Activity, unspecified: Secondary | ICD-10-CM | POA: Insufficient documentation

## 2019-10-31 DIAGNOSIS — Y999 Unspecified external cause status: Secondary | ICD-10-CM | POA: Insufficient documentation

## 2019-10-31 DIAGNOSIS — Y929 Unspecified place or not applicable: Secondary | ICD-10-CM | POA: Insufficient documentation

## 2019-10-31 DIAGNOSIS — R21 Rash and other nonspecific skin eruption: Secondary | ICD-10-CM | POA: Insufficient documentation

## 2019-10-31 NOTE — ED Notes (Signed)
Pt called for room x3  

## 2019-10-31 NOTE — ED Triage Notes (Addendum)
Pt arrives to ED w/ c/o rash and bilat ankle pain that started 1 week ago. Pt rates pain 10/10, NAD in triage. Pt states he also was bitten by bug on right leg. Pt ambulatory into triage with normal gait. Denies injury

## 2020-06-05 ENCOUNTER — Other Ambulatory Visit: Payer: Self-pay

## 2020-06-05 ENCOUNTER — Emergency Department (HOSPITAL_COMMUNITY): Payer: Self-pay

## 2020-06-05 ENCOUNTER — Emergency Department (HOSPITAL_COMMUNITY)
Admission: EM | Admit: 2020-06-05 | Discharge: 2020-06-05 | Disposition: A | Discharge status: Home / Self Care | Payer: Self-pay | Attending: Emergency Medicine | Admitting: Emergency Medicine

## 2020-06-05 DIAGNOSIS — T148XXA Other injury of unspecified body region, initial encounter: Secondary | ICD-10-CM

## 2020-06-05 DIAGNOSIS — S50812A Abrasion of left forearm, initial encounter: Secondary | ICD-10-CM | POA: Insufficient documentation

## 2020-06-05 DIAGNOSIS — F17210 Nicotine dependence, cigarettes, uncomplicated: Secondary | ICD-10-CM | POA: Insufficient documentation

## 2020-06-05 DIAGNOSIS — S61552A Open bite of left wrist, initial encounter: Secondary | ICD-10-CM | POA: Insufficient documentation

## 2020-06-05 DIAGNOSIS — W540XXA Bitten by dog, initial encounter: Secondary | ICD-10-CM | POA: Insufficient documentation

## 2020-06-05 MED ORDER — ACETAMINOPHEN 500 MG PO TABS
1000.0000 mg | ORAL_TABLET | Freq: Once | ORAL | Status: AC
  Administered 2020-06-05: 1000 mg via ORAL
  Filled 2020-06-05: qty 2

## 2020-06-05 MED ORDER — AMOXICILLIN-POT CLAVULANATE 875-125 MG PO TABS
1.0000 | ORAL_TABLET | Freq: Once | ORAL | Status: AC
  Administered 2020-06-05: 1 via ORAL
  Filled 2020-06-05: qty 1

## 2020-06-05 MED ORDER — BACITRACIN ZINC 500 UNIT/GM EX OINT
1.0000 "application " | TOPICAL_OINTMENT | Freq: Two times a day (BID) | CUTANEOUS | Status: DC
  Administered 2020-06-05: 1 via TOPICAL
  Filled 2020-06-05: qty 0.9

## 2020-06-05 MED ORDER — AMOXICILLIN-POT CLAVULANATE 875-125 MG PO TABS: 1.0000 | ORAL_TABLET | Freq: Two times a day (BID) | ORAL | 0 refills | Status: DC

## 2020-06-05 NOTE — ED Notes (Signed)
Pt's wound cleaned, ointment applied and rewrapped.
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
## 2021-02-09 ENCOUNTER — Encounter (HOSPITAL_COMMUNITY): Payer: Self-pay

## 2021-02-09 ENCOUNTER — Other Ambulatory Visit: Payer: Self-pay

## 2021-02-09 ENCOUNTER — Emergency Department (HOSPITAL_COMMUNITY)
Admission: EM | Admit: 2021-02-09 | Discharge: 2021-02-09 | Disposition: A | Discharge status: Home / Self Care | Payer: Medicaid Other | Attending: Emergency Medicine | Admitting: Emergency Medicine

## 2021-02-09 ENCOUNTER — Emergency Department (HOSPITAL_COMMUNITY): Payer: Medicaid Other

## 2021-02-09 DIAGNOSIS — F17210 Nicotine dependence, cigarettes, uncomplicated: Secondary | ICD-10-CM | POA: Insufficient documentation

## 2021-02-09 DIAGNOSIS — M79644 Pain in right finger(s): Secondary | ICD-10-CM | POA: Insufficient documentation

## 2021-02-09 DIAGNOSIS — R234 Changes in skin texture: Secondary | ICD-10-CM | POA: Insufficient documentation

## 2021-02-09 DIAGNOSIS — M79646 Pain in unspecified finger(s): Secondary | ICD-10-CM

## 2021-02-09 MED ORDER — HYDROGEN PEROXIDE 3 % EX SOLN
CUTANEOUS | Status: AC
  Filled 2021-02-09: qty 473

## 2021-02-09 NOTE — ED Notes (Signed)
Patient not in room. Not able to be found. Called listed numbers with no answer.

## 2021-02-09 NOTE — ED Provider Notes (Signed)
[facility]-EMERGENCY DEPT Provider Note   CSN: [id number] Arrival date & time: 02/09/21  1017     History Chief Complaint  Patient presents with   Wound Infection    L ring finger infection    Randall Schroeder is a 35 y.o. male.  HPI  Patient states he started having swelling in his finger about 3 weeks ago.  He noticed redness and drainage from the wound.  Patient however denies any preceding injury.  He does not remember having a cut.  Patient states the swelling has gone down however he has persistent scab in his finger.  He also has persistent tenderness.  Patient also has noticed callus formation but up of his finger.  No known fevers or chills.  No other complaints.  Patient is homeless and has been living outside  History reviewed. No pertinent past medical history.  There are no problems to display for this patient.   Past Surgical History:  Procedure Laterality Date   CLOSED REDUCTION FINGER WITH PERCUTANEOUS PINNING Right 09/15/2017   Procedure: CLOSED REDUCTION RIGHT SMALL  FINGER WITH PERCUTANEOUS PINNING;  Surgeon: Betha Loa, MD;  Location: [facility];  Service: Orthopedics;  Laterality: Right;       Family History  Problem Relation Age of Onset   Lupus Mother     Social History   Tobacco Use   Smoking status: Every Day    Years: 1.00    Types: Cigarettes   Smokeless tobacco: Former  Building services engineer Use: Never used  Substance Use Topics   Alcohol use: Yes    Comment: socially   Drug use: Yes    Types: Marijuana, Cocaine    Comment: crack- last used 4 mths, marijuana - 09/09/2017    Home Medications Prior to Admission medications   Not on File    Allergies    Patient has no known allergies.  Review of Systems   Review of Systems  All other systems reviewed and are negative.  Physical Exam Updated Vital Signs BP 123/90 (BP Location: Right Arm)   Pulse 74   Resp 18   SpO2 100%   Physical  Exam Vitals and nursing note reviewed.  Constitutional:      General: He is not in acute distress.    Appearance: He is well-developed.  HENT:     Head: Normocephalic and atraumatic.     Right Ear: External ear normal.     Left Ear: External ear normal.  Eyes:     General: No scleral icterus.       Right eye: No discharge.        Left eye: No discharge.     Conjunctiva/sclera: Conjunctivae normal.  Neck:     Trachea: No tracheal deviation.  Cardiovascular:     Rate and Rhythm: Normal rate.  Pulmonary:     Effort: Pulmonary effort is normal. No respiratory distress.     Breath sounds: No stridor.  Abdominal:     General: There is no distension.  Musculoskeletal:        General: Swelling and deformity present.     Cervical back: Neck supple.     Comments: Left ring finger with scab noted around the middle of his finger.  The tip of his finger has a callused skin tissue with some of it sloughing off, his fingers are cold no cyanosis or necrosis  Skin:    General: Skin is warm and dry.  Findings: No rash.  Neurological:     Mental Status: He is alert.     Cranial Nerves: Cranial nerve deficit: no gross deficits.       ED Results / Procedures / Treatments   Labs (all labs ordered are listed, but only abnormal results are displayed) Labs Reviewed - No data to display  EKG None  Radiology No results found.  Procedures Procedures   Medications Ordered in ED Medications - No data to display  ED Course  I have reviewed the triage vital signs and the nursing notes.  Pertinent labs & imaging results that were available during my care of the patient were reviewed by me and considered in my medical decision making (see chart for details).    MDM Rules/Calculators/A&P                           Patient presented to the ED with complaints of healing wound on his finger.  Patient noted to have callus formation and some sloughing skin and as well as some scab  formation.  No erythema noted.  concerned about persistent infection.  Plan was for an x-ray of his hand.  I asked the patient to soak his finger in warm water to help remove some of the dead tissue.  X-ray was ordered and the patient ended up eloping before getting x-rays or completing the evaluation Final Clinical Impression(s) / ED Diagnoses Final diagnoses:  Pain of finger, unspecified laterality    Rx / DC Orders ED Discharge Orders     None        Linwood Dibbles, MD 02/10/21 [phone]

## 2021-02-09 NOTE — ED Triage Notes (Signed)
Pateint Randall Schroeder. Per patient 3 weeks ago patient experience swelling in L ring finger, which resulted in purulent drainage. Swelling has gone down. Scabbing evident.

## 2022-08-29 IMAGING — DX DG WRIST COMPLETE 3+V*L*
3 series · 3 of 3 positions shown · non-contrast
Comparison: None.

CLINICAL DATA: Fall and pain

EXAM:
LEFT WRIST - COMPLETE 3+ VIEW

[wrist pa]
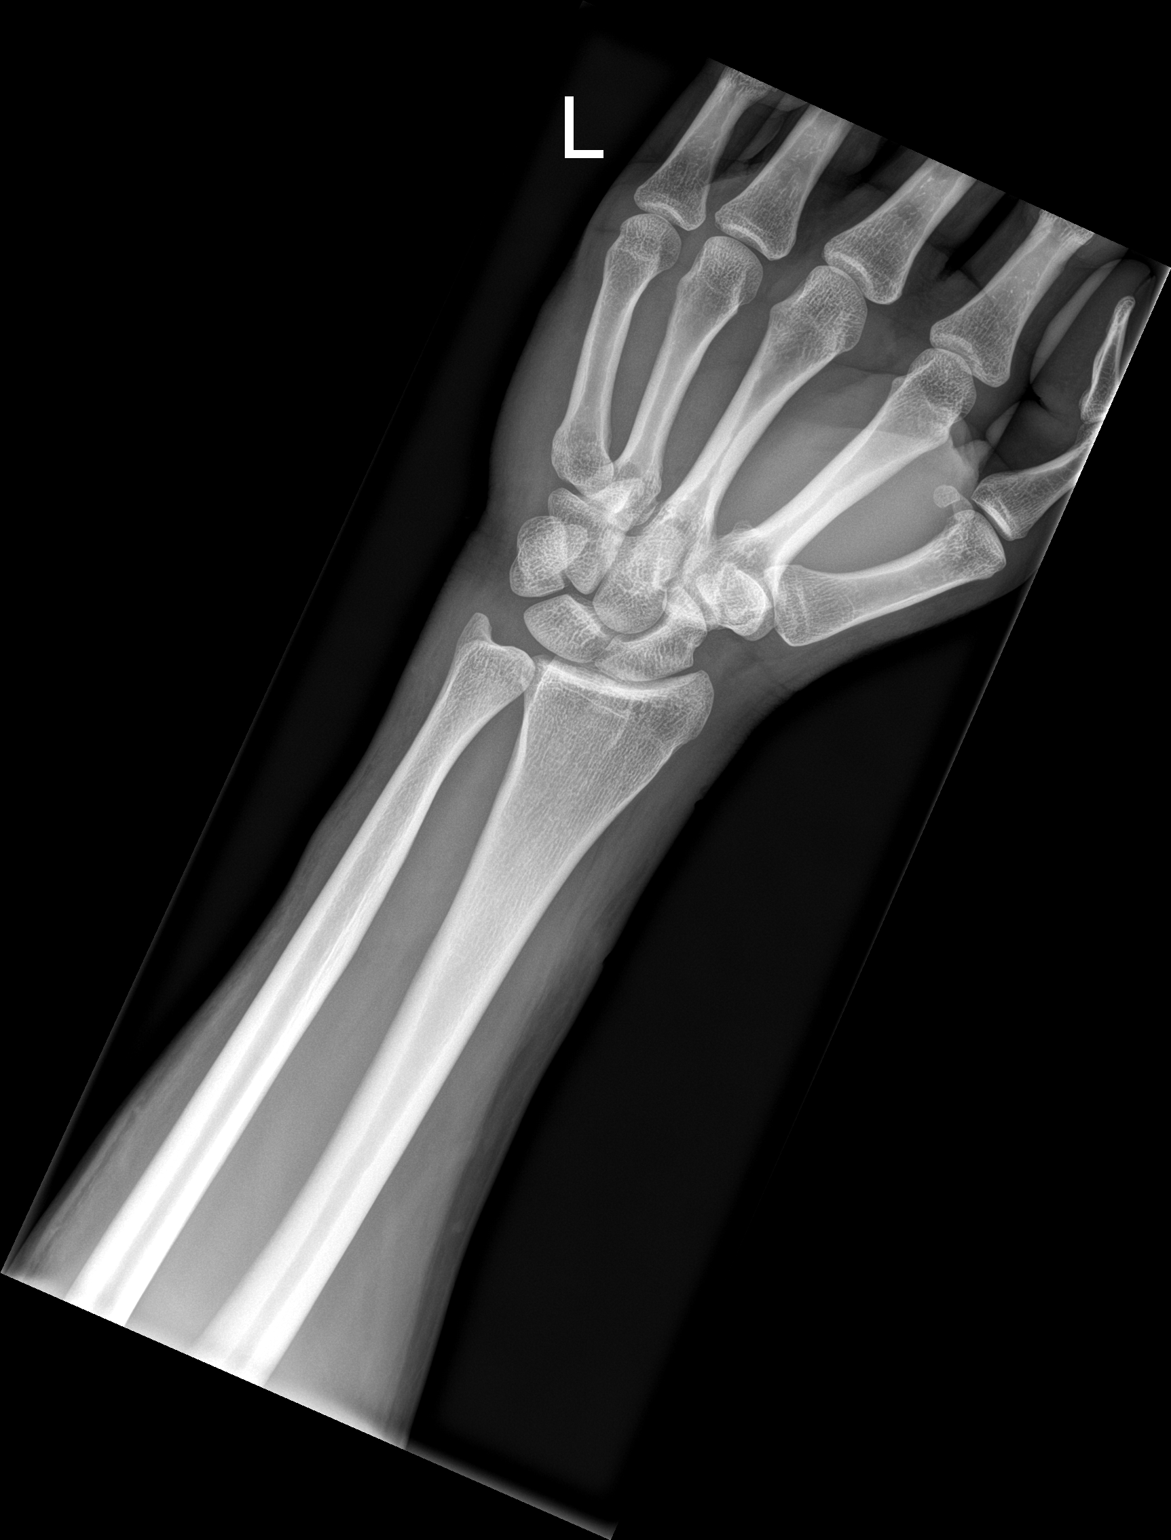

[wrist obl]
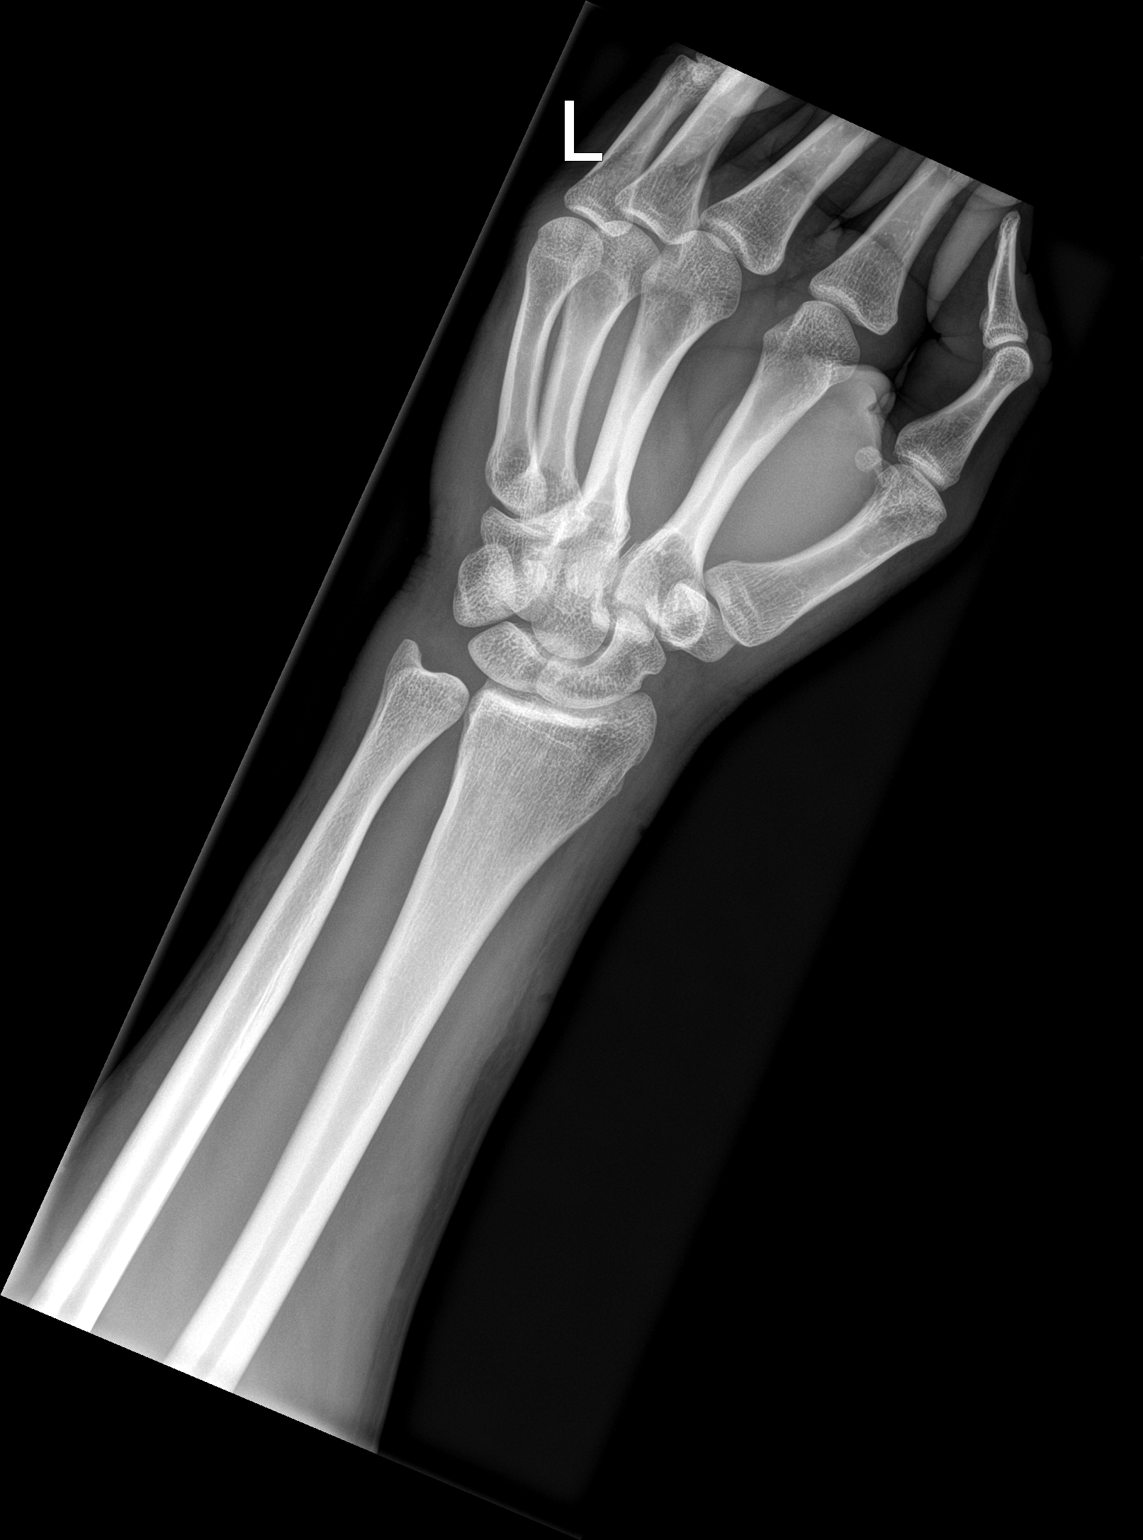

[wrist lat]
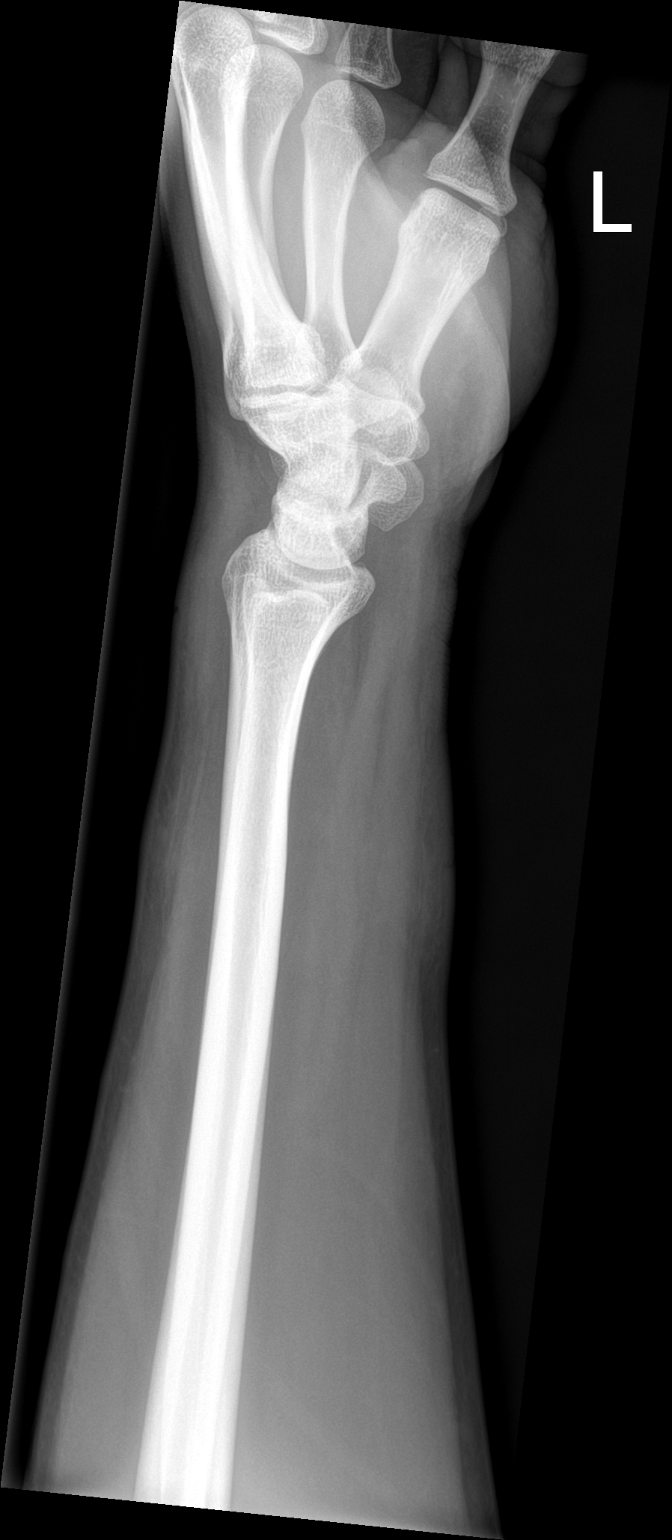

[3 of 3 positions shown; findings below may reference images not displayed]

FINDINGS: There is no evidence of fracture or dislocation. There is no
evidence of arthropathy or other focal bone abnormality. Dorsal soft
tissue swelling is seen.
IMPRESSION: Negative.

## 2024-02-16 ENCOUNTER — Other Ambulatory Visit: Payer: Self-pay

## 2024-02-16 ENCOUNTER — Emergency Department (HOSPITAL_COMMUNITY): Payer: Self-pay

## 2024-02-16 ENCOUNTER — Encounter (HOSPITAL_COMMUNITY): Payer: Self-pay

## 2024-02-16 ENCOUNTER — Emergency Department (HOSPITAL_COMMUNITY)
Admission: EM | Admit: 2024-02-16 | Discharge: 2024-02-17 | Disposition: A | Discharge status: Home / Self Care | Payer: Self-pay | Attending: Emergency Medicine | Admitting: Emergency Medicine

## 2024-02-16 DIAGNOSIS — F1910 Other psychoactive substance abuse, uncomplicated: Secondary | ICD-10-CM

## 2024-02-16 DIAGNOSIS — R7989 Other specified abnormal findings of blood chemistry: Secondary | ICD-10-CM

## 2024-02-16 DIAGNOSIS — R112 Nausea with vomiting, unspecified: Secondary | ICD-10-CM | POA: Insufficient documentation

## 2024-02-16 DIAGNOSIS — R5381 Other malaise: Secondary | ICD-10-CM | POA: Insufficient documentation

## 2024-02-16 DIAGNOSIS — R109 Unspecified abdominal pain: Secondary | ICD-10-CM | POA: Insufficient documentation

## 2024-02-16 DIAGNOSIS — R000 Tachycardia, unspecified: Secondary | ICD-10-CM | POA: Insufficient documentation

## 2024-02-16 DIAGNOSIS — F17210 Nicotine dependence, cigarettes, uncomplicated: Secondary | ICD-10-CM | POA: Insufficient documentation

## 2024-02-16 DIAGNOSIS — D72829 Elevated white blood cell count, unspecified: Secondary | ICD-10-CM | POA: Insufficient documentation

## 2024-02-16 DIAGNOSIS — R42 Dizziness and giddiness: Secondary | ICD-10-CM | POA: Insufficient documentation

## 2024-02-16 LAB — T4, FREE: Free T4: 1.04 ng/dL (ref 0.61–1.12)

## 2024-02-16 LAB — CBC
HCT: 46.2 % (ref 39.0–52.0)
Hemoglobin: 15.8 g/dL (ref 13.0–17.0)
MCH: 30.2 pg (ref 26.0–34.0)
MCHC: 34.2 g/dL (ref 30.0–36.0)
MCV: 88.3 fL (ref 80.0–100.0)
Platelets: 221 K/uL (ref 150–400)
RBC: 5.23 MIL/uL (ref 4.22–5.81)
RDW: 13.1 % (ref 11.5–15.5)
WBC: 26.4 K/uL — ABNORMAL HIGH (ref 4.0–10.5)
nRBC: 0 % (ref 0.0–0.2)

## 2024-02-16 LAB — COMPREHENSIVE METABOLIC PANEL WITH GFR
ALT: 37 U/L (ref 0–44)
AST: 74 U/L — ABNORMAL HIGH (ref 15–41)
Albumin: 4.6 g/dL (ref 3.5–5.0)
Alkaline Phosphatase: 72 U/L (ref 38–126)
Anion gap: 21 — ABNORMAL HIGH (ref 5–15)
BUN: 21 mg/dL — ABNORMAL HIGH (ref 6–20)
CO2: 18 mmol/L — ABNORMAL LOW (ref 22–32)
Calcium: 9.7 mg/dL (ref 8.9–10.3)
Chloride: 100 mmol/L (ref 98–111)
Creatinine, Ser: 1.67 mg/dL — ABNORMAL HIGH (ref 0.61–1.24)
GFR, Estimated: 53 mL/min — ABNORMAL LOW (ref >60)
Glucose, Bld: 106 mg/dL — ABNORMAL HIGH (ref 70–99)
Potassium: 3.9 mmol/L (ref 3.5–5.1)
Sodium: 139 mmol/L (ref 135–145)
Total Bilirubin: 1.3 mg/dL — ABNORMAL HIGH (ref 0.0–1.2)
Total Protein: 8.3 g/dL — ABNORMAL HIGH (ref 6.5–8.1)

## 2024-02-16 LAB — URINALYSIS, ROUTINE W REFLEX MICROSCOPIC
Bilirubin Urine: NEGATIVE
Glucose, UA: NEGATIVE mg/dL
Ketones, ur: 20 mg/dL — AB
Leukocytes,Ua: NEGATIVE
Nitrite: NEGATIVE
Protein, ur: 100 mg/dL — AB
Specific Gravity, Urine: 1.033 — ABNORMAL HIGH (ref 1.005–1.030)
pH: 5 (ref 5.0–8.0)

## 2024-02-16 LAB — RAPID URINE DRUG SCREEN, HOSP PERFORMED
Amphetamines: NOT DETECTED
Barbiturates: NOT DETECTED
Benzodiazepines: NOT DETECTED
Cocaine: POSITIVE — AB
Opiates: NOT DETECTED
Tetrahydrocannabinol: POSITIVE — AB

## 2024-02-16 LAB — TSH: TSH: 0.243 u[IU]/mL — ABNORMAL LOW (ref 0.350–4.500)

## 2024-02-16 LAB — TROPONIN I (HIGH SENSITIVITY)
Troponin I (High Sensitivity): 11 ng/L (ref <18)
Troponin I (High Sensitivity): 9 ng/L (ref <18)

## 2024-02-16 LAB — I-STAT CG4 LACTIC ACID, ED: Lactic Acid, Venous: 1.1 mmol/L (ref 0.5–1.9)

## 2024-02-16 LAB — LIPASE, BLOOD: Lipase: 20 U/L (ref 11–51)

## 2024-02-16 LAB — ETHANOL: Alcohol, Ethyl (B): <15 mg/dL (ref <15)

## 2024-02-16 MED ORDER — ONDANSETRON HCL 4 MG/2ML IJ SOLN
4.0000 mg | Freq: Once | INTRAMUSCULAR | Status: AC
  Administered 2024-02-16: 4 mg via INTRAVENOUS
  Filled 2024-02-16: qty 2

## 2024-02-16 MED ORDER — IOHEXOL 350 MG/ML SOLN
75.0000 mL | Freq: Once | INTRAVENOUS | Status: AC | PRN
  Administered 2024-02-16: 75 mL via INTRAVENOUS

## 2024-02-16 MED ORDER — SODIUM CHLORIDE 0.9 % IV BOLUS
1000.0000 mL | Freq: Once | INTRAVENOUS | Status: AC
  Administered 2024-02-16: 1000 mL via INTRAVENOUS

## 2024-02-16 MED ORDER — LACTATED RINGERS IV BOLUS
500.0000 mL | Freq: Once | INTRAVENOUS | Status: AC
  Administered 2024-02-16: 500 mL via INTRAVENOUS

## 2024-02-16 NOTE — ED Provider Triage Note (Signed)
 Emergency Medicine Provider Triage Evaluation Note  Randall Schroeder , a 38 y.o. male  was evaluated in triage.  Pt complains of nausea, vomiting, lightheadedness.  Smoked something and began to feel off.  Drank one beer today. Does not have hx of regular etoh use. Smokes cigarettes. No known fam hx of early heart disease. No hx of dvt or pe.  Has not regularly seen physician with exception of being seen while in prison and was not diagnosed with any chronic illness at that time.  Review of Systems  Positive: Cough, chest wall pain, nausea, vomiting, lightheadedness Negative: Abdominal pain, diarrhea, constipation, headache  Physical Exam  BP (!) 135/96 (BP Location: Right Arm)   Pulse (!) 122   Temp 99.1 F (37.3 C)   Resp 20   Ht 5' 11 (1.803 m)   Wt 90.7 kg   SpO2 99%   BMI 27.89 kg/m  Gen:   Awake, no distress   Resp:  Normal effort  MSK:   Moves extremities without difficulty, left chest wall tenderness, no abdominal tenderness  Other:  No asymmetric edema tachycardia  Medical Decision Making  Medically screening exam initiated at 6:06 PM.  Appropriate orders placed.  Randall Schroeder was informed that the remainder of the evaluation will be completed by another provider, this initial triage assessment does not replace that evaluation, and the importance of remaining in the ED until their evaluation is complete.  Will place orders for evaluation//nausea medicine. ECG with sinus tachycardia.    Randall Longs, MD 02/16/24 [phone]
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
## 2024-02-16 NOTE — ED Notes (Signed)
 Unable to provide urine sample att
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
## 2024-02-17 ENCOUNTER — Emergency Department (HOSPITAL_COMMUNITY)
Admission: EM | Admit: 2024-02-17 | Discharge: 2024-02-17 | Disposition: A | Discharge status: Home / Self Care | Payer: Self-pay | Attending: Emergency Medicine | Admitting: Emergency Medicine

## 2024-02-17 ENCOUNTER — Other Ambulatory Visit: Payer: Self-pay

## 2024-02-17 ENCOUNTER — Encounter (HOSPITAL_COMMUNITY): Payer: Self-pay

## 2024-02-17 DIAGNOSIS — R059 Cough, unspecified: Secondary | ICD-10-CM | POA: Insufficient documentation

## 2024-02-17 DIAGNOSIS — R531 Weakness: Secondary | ICD-10-CM | POA: Insufficient documentation

## 2024-02-17 DIAGNOSIS — R112 Nausea with vomiting, unspecified: Secondary | ICD-10-CM | POA: Insufficient documentation

## 2024-02-17 DIAGNOSIS — R251 Tremor, unspecified: Secondary | ICD-10-CM | POA: Insufficient documentation

## 2024-02-17 DIAGNOSIS — D72829 Elevated white blood cell count, unspecified: Secondary | ICD-10-CM | POA: Insufficient documentation

## 2024-02-17 DIAGNOSIS — R5383 Other fatigue: Secondary | ICD-10-CM | POA: Insufficient documentation

## 2024-02-17 DIAGNOSIS — R109 Unspecified abdominal pain: Secondary | ICD-10-CM | POA: Insufficient documentation

## 2024-02-17 DIAGNOSIS — R000 Tachycardia, unspecified: Secondary | ICD-10-CM | POA: Insufficient documentation

## 2024-02-17 LAB — COMPREHENSIVE METABOLIC PANEL WITH GFR
ALT: 40 U/L (ref 0–44)
AST: 67 U/L — ABNORMAL HIGH (ref 15–41)
Albumin: 4.5 g/dL (ref 3.5–5.0)
Alkaline Phosphatase: 78 U/L (ref 38–126)
Anion gap: 15 (ref 5–15)
BUN: 18 mg/dL (ref 6–20)
CO2: 24 mmol/L (ref 22–32)
Calcium: 9.5 mg/dL (ref 8.9–10.3)
Chloride: 98 mmol/L (ref 98–111)
Creatinine, Ser: 1.12 mg/dL (ref 0.61–1.24)
GFR, Estimated: >60 mL/min (ref >60)
Glucose, Bld: 104 mg/dL — ABNORMAL HIGH (ref 70–99)
Potassium: 3.6 mmol/L (ref 3.5–5.1)
Sodium: 137 mmol/L (ref 135–145)
Total Bilirubin: 0.7 mg/dL (ref 0.0–1.2)
Total Protein: 7.9 g/dL (ref 6.5–8.1)

## 2024-02-17 LAB — LIPASE, BLOOD: Lipase: 59 U/L — ABNORMAL HIGH (ref 11–51)

## 2024-02-17 LAB — CBC WITH DIFFERENTIAL/PLATELET
Abs Immature Granulocytes: 0.06 K/uL (ref 0.00–0.07)
Basophils Absolute: 0 K/uL (ref 0.0–0.1)
Basophils Relative: 0 %
Eosinophils Absolute: 0 K/uL (ref 0.0–0.5)
Eosinophils Relative: 0 %
HCT: 42.2 % (ref 39.0–52.0)
Hemoglobin: 14.8 g/dL (ref 13.0–17.0)
Immature Granulocytes: 0 %
Lymphocytes Relative: 8 %
Lymphs Abs: 1.4 K/uL (ref 0.7–4.0)
MCH: 30.8 pg (ref 26.0–34.0)
MCHC: 35.1 g/dL (ref 30.0–36.0)
MCV: 87.7 fL (ref 80.0–100.0)
Monocytes Absolute: 1.4 K/uL — ABNORMAL HIGH (ref 0.1–1.0)
Monocytes Relative: 8 %
Neutro Abs: 15.4 K/uL — ABNORMAL HIGH (ref 1.7–7.7)
Neutrophils Relative %: 84 %
Platelets: 204 K/uL (ref 150–400)
RBC: 4.81 MIL/uL (ref 4.22–5.81)
RDW: 13.1 % (ref 11.5–15.5)
WBC: 18.3 K/uL — ABNORMAL HIGH (ref 4.0–10.5)
nRBC: 0 % (ref 0.0–0.2)

## 2024-02-17 LAB — RESP PANEL BY RT-PCR (RSV, FLU A&B, COVID)  RVPGX2
Influenza A by PCR: NEGATIVE
Influenza B by PCR: NEGATIVE
Resp Syncytial Virus by PCR: NEGATIVE
SARS Coronavirus 2 by RT PCR: NEGATIVE

## 2024-02-17 LAB — URINALYSIS, ROUTINE W REFLEX MICROSCOPIC
Bilirubin Urine: NEGATIVE
Glucose, UA: NEGATIVE mg/dL
Hgb urine dipstick: NEGATIVE
Ketones, ur: 80 mg/dL — AB
Leukocytes,Ua: NEGATIVE
Nitrite: NEGATIVE
Protein, ur: 30 mg/dL — AB
Specific Gravity, Urine: 1.03 (ref 1.005–1.030)
pH: 6 (ref 5.0–8.0)

## 2024-02-17 MED ORDER — SODIUM CHLORIDE 0.9 % IV BOLUS
1000.0000 mL | Freq: Once | INTRAVENOUS | Status: AC
  Administered 2024-02-17: 1000 mL via INTRAVENOUS

## 2024-02-17 MED ORDER — ONDANSETRON HCL 4 MG/2ML IJ SOLN
4.0000 mg | Freq: Once | INTRAMUSCULAR | Status: AC
  Administered 2024-02-17: 4 mg via INTRAVENOUS
  Filled 2024-02-17: qty 2

## 2024-02-17 MED ORDER — DROPERIDOL 2.5 MG/ML IJ SOLN
1.2500 mg | Freq: Once | INTRAMUSCULAR | Status: AC
  Administered 2024-02-17: 1.25 mg via INTRAVENOUS
  Filled 2024-02-17: qty 2

## 2024-02-17 MED ORDER — ONDANSETRON HCL 4 MG PO TABS: 4.0000 mg | ORAL_TABLET | Freq: Three times a day (TID) | ORAL | 0 refills | Status: AC | PRN

## 2024-02-17 NOTE — ED Provider Notes (Signed)
 Assumed care at change of shift pending PO challenge after vomiting. Work up otherwise completed and ready for dc.  Physical Exam  BP 116/80   Pulse (!) 115   Temp 98.4 F (36.9 C) (Oral)   Resp 18   Ht 5' 11 (1.803 m)   Wt 90.7 kg   SpO2 99%   BMI 27.89 kg/m   Physical Exam  Procedures  Procedures  ED Course / MDM    Medical Decision Making Amount and/or Complexity of Data Reviewed Labs: ordered.  Risk Prescription drug management.   Patient feeling better, requesting dc. Discussed abnormal labs and need for PCP follow up. Patient verbalized understanding.        Beverley Leita LABOR, PA-C 02/17/24 0107    Haze Lonni PARAS, MD 02/18/24 [phone]

## 2024-02-17 NOTE — ED Notes (Signed)
 Pt does not have to void at this time. MD aware

## 2024-02-17 NOTE — ED Notes (Signed)
 Pt says he is still unable to urine; says he will let the fluid sit for a minute and then try

## 2024-02-17 NOTE — ED Triage Notes (Addendum)
 Pt BIB EMS Pt has cough and flu like symptoms x 2 days. Pt states he feels weak and is shaking and vomiting.

## 2024-02-17 NOTE — ED Provider Notes (Signed)
  EMERGENCY DEPARTMENT AT [facility] Provider Note   CSN: [id number] Arrival date & time: 02/17/24  1335     Patient presents with: Cough   Randall Schroeder is a 38 y.o. male.   The history is provided by the patient and medical records. No language interpreter was used.  Cough    38 year old male with history of polysubstance use presenting with complaint of flulike symptoms.  For the past 3 days patient states has had chills, abdominal discomfort, nausea, vomiting, tremors, weak, fatigue, and overall not feeling well.  He was seen yesterday in the ED and states he was given some fluid but discharged home.  He initially felt a bit better but his symptoms returned prompting this ER visit.  He reportedly recently released from prison.  He did admits to polysubstance use including cocaine and marijuana use.  Prior to Admission medications   Not on File    Allergies: Patient has no known allergies.    Review of Systems  Respiratory:  Positive for cough.   All other systems reviewed and are negative.   Updated Vital Signs BP (!) 159/94 (BP Location: Left Arm)   Pulse (!) 104   Temp 98.5 F (36.9 C) (Oral)   Resp 18   SpO2 99%   Physical Exam Constitutional:      General: He is not in acute distress.    Appearance: He is well-developed.  HENT:     Head: Atraumatic.  Eyes:     Conjunctiva/sclera: Conjunctivae normal.  Cardiovascular:     Rate and Rhythm: Tachycardia present.     Pulses: Normal pulses.     Heart sounds: Normal heart sounds.  Pulmonary:     Effort: Pulmonary effort is normal.     Breath sounds: Normal breath sounds.  Abdominal:     General: Bowel sounds are normal.     Palpations: Abdomen is soft.     Tenderness: There is no abdominal tenderness.  Musculoskeletal:     Cervical back: Normal range of motion and neck supple.  Skin:    Findings: No rash.  Neurological:     Mental Status: He is alert.     (all labs ordered  are listed, but only abnormal results are displayed) Labs Reviewed  COMPREHENSIVE METABOLIC PANEL WITH GFR - Abnormal; Notable for the following components:      Result Value   Glucose, Bld 104 (*)    AST 67 (*)    All other components within normal limits  LIPASE, BLOOD - Abnormal; Notable for the following components:   Lipase 59 (*)    All other components within normal limits  CBC WITH DIFFERENTIAL/PLATELET - Abnormal; Notable for the following components:   WBC 18.3 (*)    Neutro Abs 15.4 (*)    Monocytes Absolute 1.4 (*)    All other components within normal limits  URINALYSIS, ROUTINE W REFLEX MICROSCOPIC - Abnormal; Notable for the following components:   Ketones, ur 80 (*)    Protein, ur 30 (*)    Bacteria, UA RARE (*)    All other components within normal limits  RESP PANEL BY RT-PCR (RSV, FLU A&B, COVID)  [id number]    EKG: None  Radiology: CT ABDOMEN PELVIS W CONTRAST Result Date: 02/16/2024 EXAM: CT ABDOMEN AND PELVIS WITH CONTRAST 02/16/2024 09:44:00 PM TECHNIQUE: CT of the abdomen and pelvis was performed with the administration of 75 mL of iohexol (OMNIPAQUE) 350 MG/ML injection. Multiplanar reformatted images are provided for  review. Automated exposure control, iterative reconstruction, and/or weight-based adjustment of the mA/kV was utilized to reduce the radiation dose to as low as reasonably achievable. COMPARISON: None available. CLINICAL HISTORY: Nausea and vomiting FINDINGS: LOWER CHEST: Lung bases demonstrate some mild atelectatic changes in the right middle lobe. LIVER: The liver is within normal limits. GALLBLADDER AND BILE DUCTS: The gallbladder is within normal limits. No biliary ductal dilatation. SPLEEN: The spleen is unremarkable. PANCREAS: The pancreas is unremarkable. ADRENAL GLANDS: The adrenal glands are within normal limits. KIDNEYS, URETERS AND BLADDER: The kidneys demonstrate a normal enhancement pattern. No calculi are seen. No obstructive changes are  noted. The bladder is decompressed. GI AND BOWEL: Stomach is within normal limits. Small bowel is within normal limits. No obstructive or inflammatory changes of the colon are seen. Scattered mild diverticular changes noted without diverticulitis. The appendix is within normal limits. PERITONEUM AND RETROPERITONEUM: No free air or free fluid is seen. VASCULATURE: Aorta is unremarkable. LYMPH NODES: No lymphadenopathy. REPRODUCTIVE ORGANS: Prostate is within normal limits. BONES AND SOFT TISSUES: No bony abnormality is noted. No focal soft tissue abnormality. IMPRESSION: 1. No evidence of bowel obstruction. 2. Scattered mild diverticular changes without diverticulitis. Electronically signed by: Oneil Devonshire MD 02/16/2024 09:50 PM EST RP Workstation: [id number]   DG Chest 2 View Result Date: 02/16/2024 EXAM: 2 VIEW(S) XRAY OF THE CHEST 02/16/2024 08:00:07 PM COMPARISON: None available. CLINICAL HISTORY: cp FINDINGS: LUNGS AND PLEURA: No focal pulmonary opacity. No pulmonary edema. No pleural effusion. No pneumothorax. HEART AND MEDIASTINUM: No acute abnormality of the cardiac and mediastinal silhouettes. BONES AND SOFT TISSUES: No acute osseous abnormality. IMPRESSION: 1. No acute cardiopulmonary process. Electronically signed by: Pinkie Pebbles MD 02/16/2024 08:04 PM EST RP Workstation: [id number]     Procedures   Medications Ordered in the ED  sodium chloride 0.9 % bolus 1,000 mL (1,000 mLs Intravenous New Bag/Given 02/17/24 1541)  ondansetron  (ZOFRAN ) injection 4 mg (4 mg Intravenous Given 02/17/24 1540)  droperidol (INAPSINE) 2.5 MG/ML injection 1.25 mg (1.25 mg Intravenous Given 02/17/24 1541)                                    Medical Decision Making Amount and/or Complexity of Data Reviewed Labs: ordered.  Risk Prescription drug management.   BP (!) 156/96 (BP Location: Left Arm)   Pulse (!) 101   Temp 99.1 F (37.3 C) (Oral)   Resp 18   SpO2 97%   21:37 PM  38 year old male  with history of polysubstance use presenting with complaint of flulike symptoms.  For the past 3 days patient states has had chills, abdominal discomfort, nausea, vomiting, tremors, weak, fatigue, and overall not feeling well.  He was seen yesterday in the ED and states he was given some fluid but discharged home.  He initially felt a bit better but his symptoms returned prompting this ER visit.  He reportedly recently released from prison.  He did admits to polysubstance use including cocaine and marijuana use.  Exam overall reassuring, nonacute abdomen.  Patient is mildly tachycardic.  I did review labs and imaging that was obtained from yesterday visit.  Patient did have leukocytosis with elevated white count of 26.4.  On recheck, his current WBC is 18.3.  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  His recent abdominal pelvic CT scan without any concerning finding, as chest x-ray shows no  concerning feature.  After receiving droperidol and IV fluid, patient reported feeling much better and stable for discharge.  Tolerates p.o.  Recommend avoidance of marijuana use as it may likely worsen his condition.  Patient voiced understanding and agrees to plan.  Return precaution given.     Final diagnoses:  Nausea and vomiting, unspecified vomiting type    ED Discharge Orders          Ordered    ondansetron  (ZOFRAN ) 4 MG tablet  Every 8 hours PRN        02/17/24 1930               Nivia Colon, PA-C 02/17/24 1931    Randol Simmonds, MD 02/19/24 1251

## 2024-02-17 NOTE — Discharge Instructions (Addendum)
 Your symptoms likely due to marijuana use.  Avoid marijuana use as it may worsen your condition.  Take Zofran  as needed for nausea.

## 2024-02-17 NOTE — ED Notes (Addendum)
 Randall Schroeder

## 2024-02-17 NOTE — Discharge Instructions (Signed)
 Consider outpatient counseling for substance abuse.  Provided with resource list.  You will need to follow-up with a primary care provider for your abnormal white blood cell count as well as your abnormal thyroid test.  Please contact [place] [facility] to schedule this appointment.
